# Patient Record
Sex: Male | Born: 1945 | Race: White | Hispanic: No | Marital: Married
Health system: Southern US, Community
[De-identification: ages and names within clinical notes are randomized; demographics above are authoritative.]

---

## 2008-05-26 ENCOUNTER — Emergency Department: Payer: Self-pay | Admitting: Emergency Medicine

## 2010-10-06 ENCOUNTER — Emergency Department: Payer: Self-pay | Admitting: Emergency Medicine

## 2011-07-21 ENCOUNTER — Inpatient Hospital Stay: Payer: Self-pay | Admitting: Internal Medicine

## 2011-07-21 LAB — CBC WITH DIFFERENTIAL/PLATELET
Basophil #: 0 10*3/uL (ref 0.0–0.1)
Basophil %: 0.1 %
Eosinophil #: 0.1 10*3/uL (ref 0.0–0.7)
Eosinophil %: 1.3 %
HGB: 16.5 g/dL (ref 13.0–18.0)
Lymphocyte #: 1.8 10*3/uL (ref 1.0–3.6)
Lymphocyte %: 20.7 %
MCH: 29.9 pg (ref 26.0–34.0)
MCV: 88 fL (ref 80–100)
Monocyte #: 0.3 10*3/uL (ref 0.0–0.7)
Neutrophil %: 74.8 %
Platelet: 324 10*3/uL (ref 150–440)
RBC: 5.52 10*6/uL (ref 4.40–5.90)
RDW: 13.6 % (ref 11.5–14.5)

## 2011-07-21 LAB — COMPREHENSIVE METABOLIC PANEL
Albumin: 3.7 g/dL (ref 3.4–5.0)
Anion Gap: 8 (ref 7–16)
BUN: 19 mg/dL — ABNORMAL HIGH (ref 7–18)
Calcium, Total: 9.8 mg/dL (ref 8.5–10.1)
Chloride: 103 mmol/L (ref 98–107)
Co2: 28 mmol/L (ref 21–32)
Creatinine: 1.21 mg/dL (ref 0.60–1.30)
EGFR (African American): >60
EGFR (Non-African Amer.): >60
Glucose: 101 mg/dL — ABNORMAL HIGH (ref 65–99)
Osmolality: 280 (ref 275–301)
Potassium: 4 mmol/L (ref 3.5–5.1)
SGOT(AST): 27 U/L (ref 15–37)
Sodium: 139 mmol/L (ref 136–145)
Total Protein: 8.4 g/dL — ABNORMAL HIGH (ref 6.4–8.2)

## 2011-07-21 LAB — PROTIME-INR
INR: 1.5
Prothrombin Time: 18.1 secs — ABNORMAL HIGH (ref 11.5–14.7)

## 2011-07-22 LAB — PROTIME-INR
INR: 1.6
Prothrombin Time: 19.6 secs — ABNORMAL HIGH (ref 11.5–14.7)

## 2011-07-23 LAB — PROTIME-INR
INR: 1.5
Prothrombin Time: 18 secs — ABNORMAL HIGH (ref 11.5–14.7)

## 2011-10-22 IMAGING — CT CT MAXILLOFACIAL WITHOUT CONTRAST
1 series · 16 of 30 positions shown, 20 images · non-contrast
Comparison: none

REASON FOR EXAM: swelling and ecchymosis R maxilla and orbit
COMMENTS:

[Series 4: coronal · coronal · 0.34mm/px · 16 of 47 slices shown, 20 images]
[im 4/47  brain]
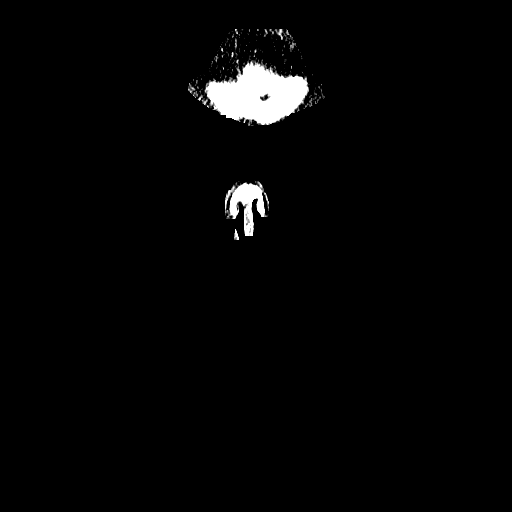
[im 4/47  bone]
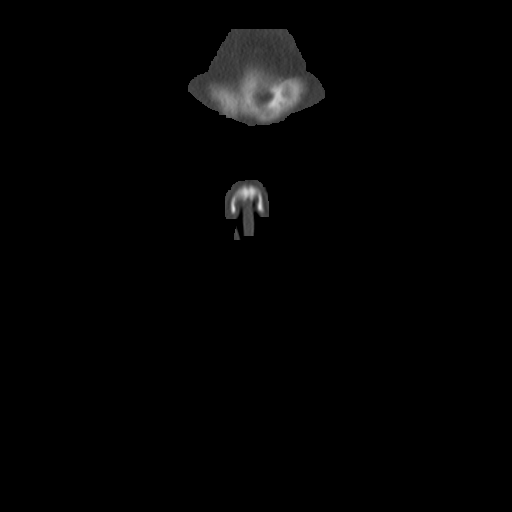
[im 7/47  bone]
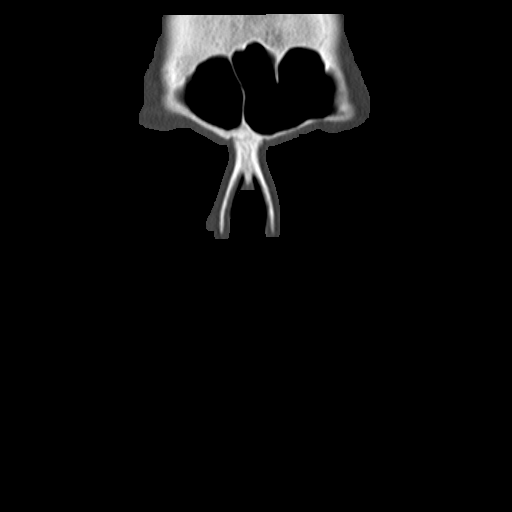
[im 10/47  bone]
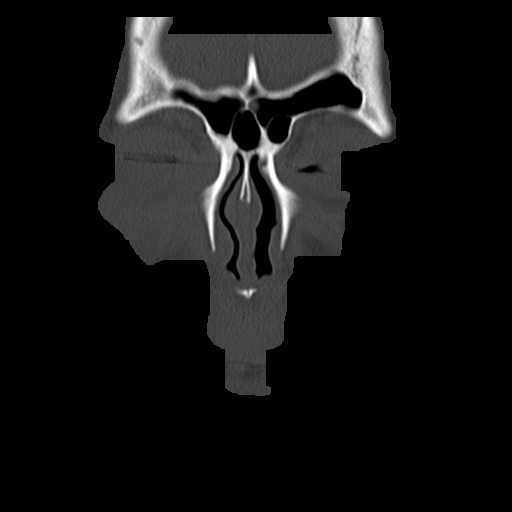
[im 12/47  bone]
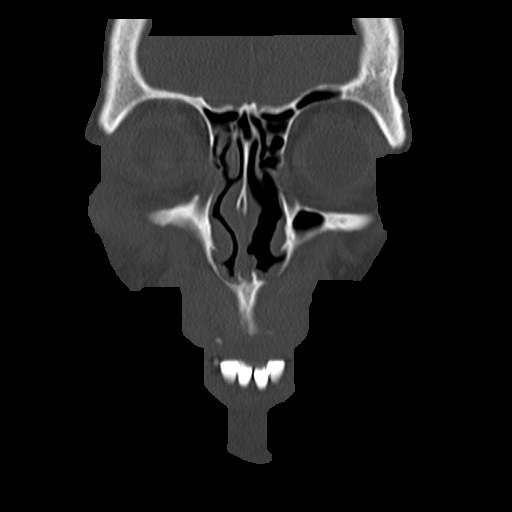
[im 15/47  brain]
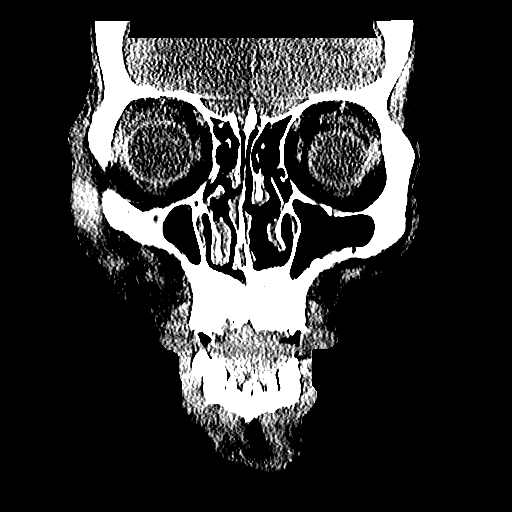
[im 15/47  bone]
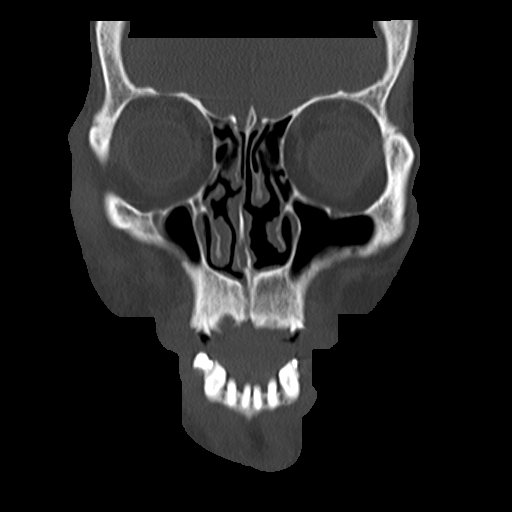
[im 18/47  bone]
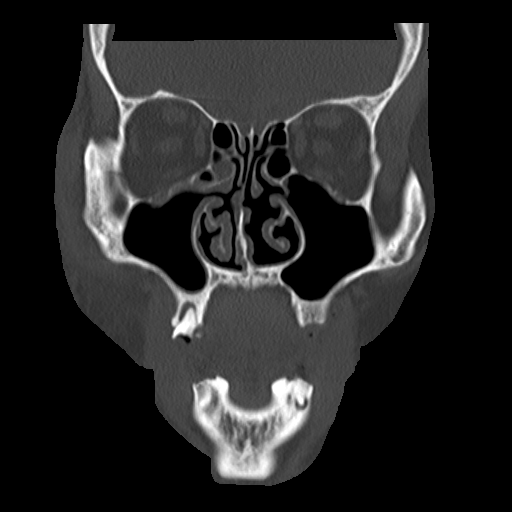
[im 20/47  bone]
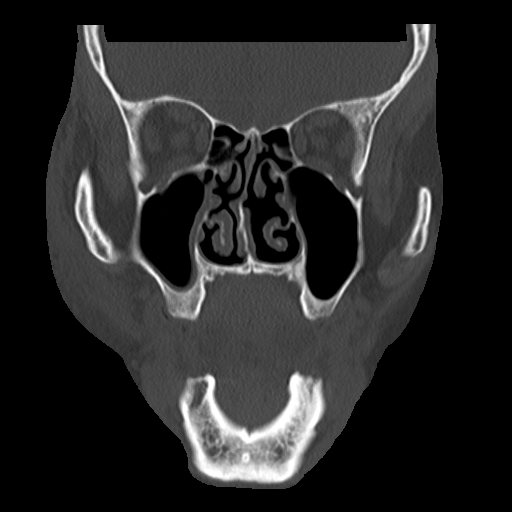
[im 23/47  bone]
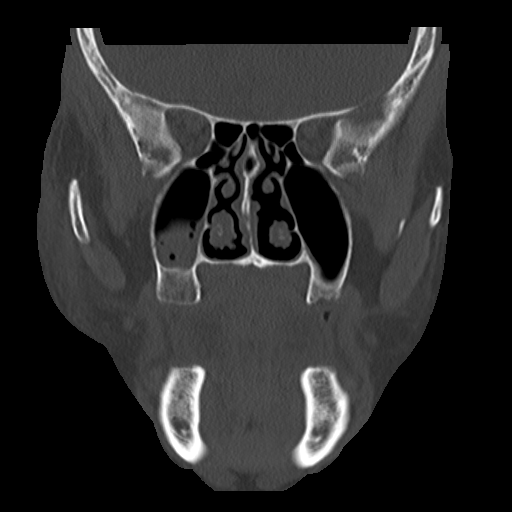
[im 26/47  brain]
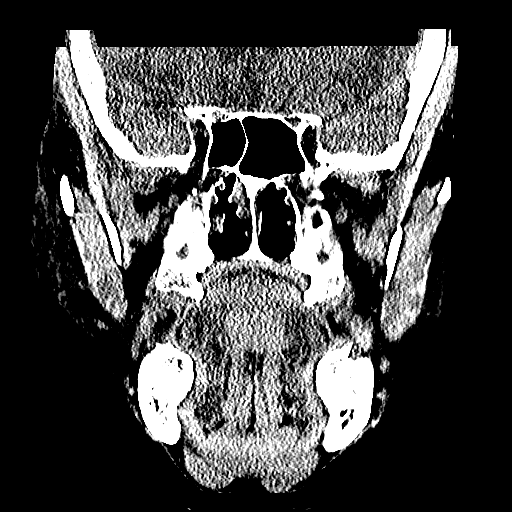
[im 26/47  bone]
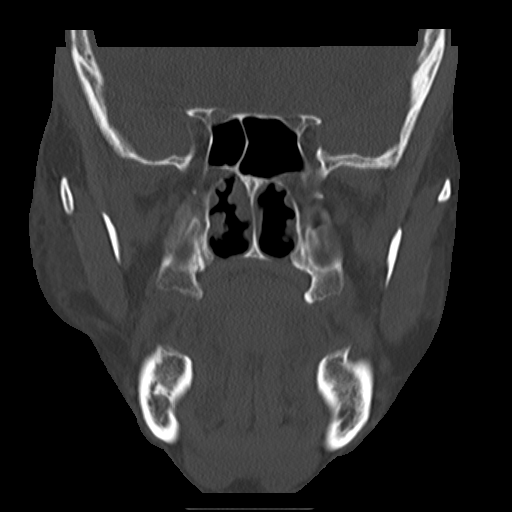
[im 29/47  bone]
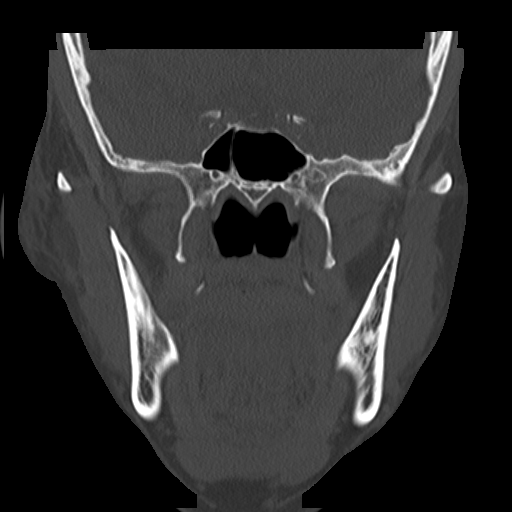
[im 31/47  bone]
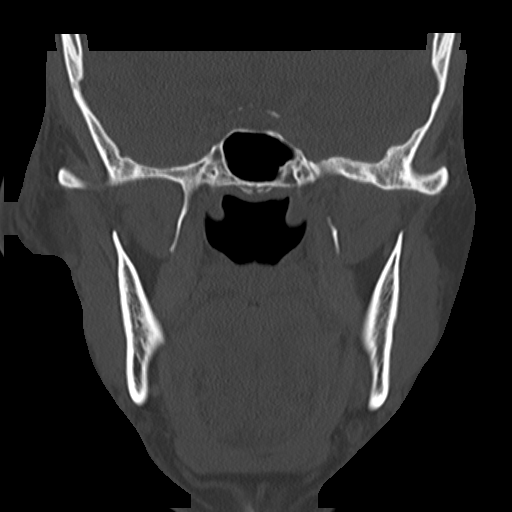
[im 34/47  bone]
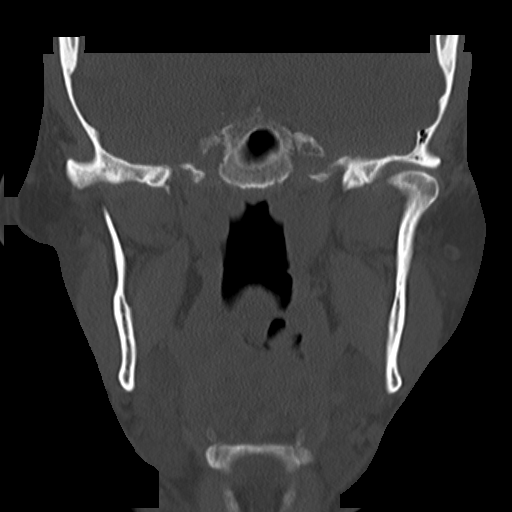
[im 37/47  brain]
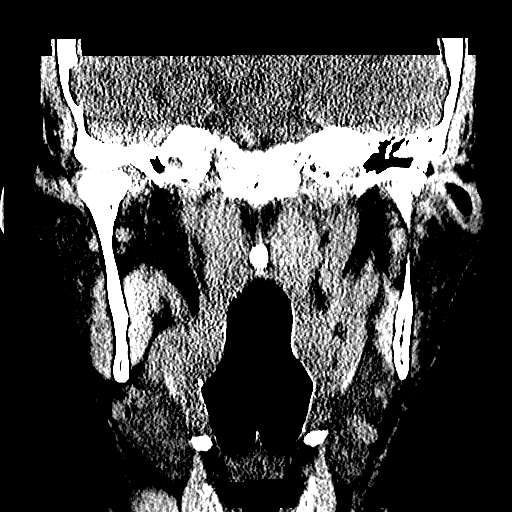
[im 37/47  bone]
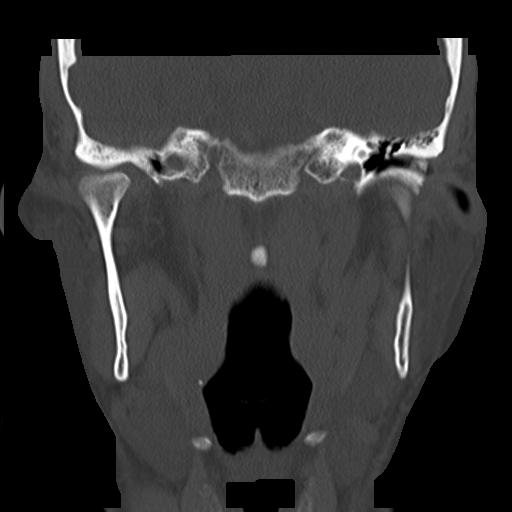
[im 39/47  bone]
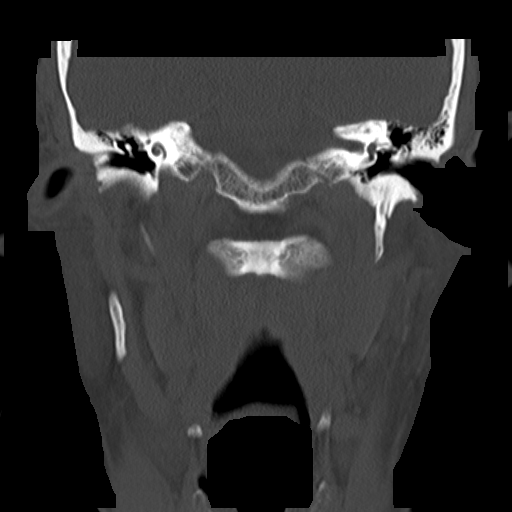
[im 42/47  bone]
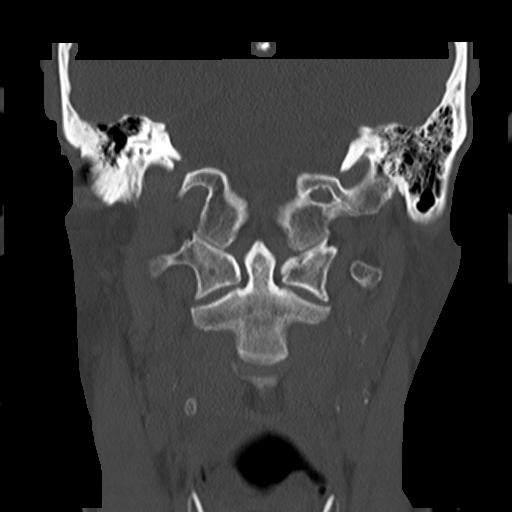
[im 45/47  bone]
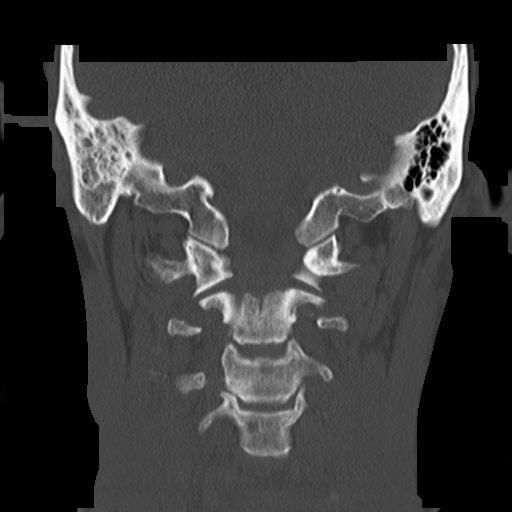

[16 of 30 positions shown; findings below may reference images not displayed]

PROCEDURE:     CT  - CT MAXILLOFACIAL AREA WO  - October 06, 2010  [DATE]

RESULT:     Emergent noncontrast CT of the maxillofacial structures
demonstrates air-fluid levels in the maxillary sinuses bilaterally. Is no
evidence of air within the orbits or pneumocephalus. The frontal, sphenoid
and ethmoid sinuses are predominantly normally aerated. The pterygoid plates
appear intact. Zygomatic arches appear intact. There is significant lucency
around the periodontal regions consistent with significant. On full disease.
The temporomandibular joints appear to be within normal limits. The nasal
septum demonstrates a slight curvature toward the right. The orbits appear
intact.
IMPRESSION: Air-fluid levels in the maxillary sinuses of uncertain
etiology. Occult fractures not completely excluded but no displaced fracture
is evident. Otherwise the appearance is unremarkable.

## 2012-08-05 IMAGING — CR DG ANKLE COMPLETE 3+V*L*
1 series · 5 of 5 positions shown · non-contrast
Comparison: none

REASON FOR EXAM: pain/edema 2nd to fall
COMMENTS:   May transport without cardiac monitor

PROCEDURE:     DXR - DXR ANKLE LEFT COMPLETE  - July 21, 2011 [DATE]
RESULT:     Comparison: None.

[Series 1: ap · 0.17mm/px · 5 of 5 slices shown]
[im 1/5]
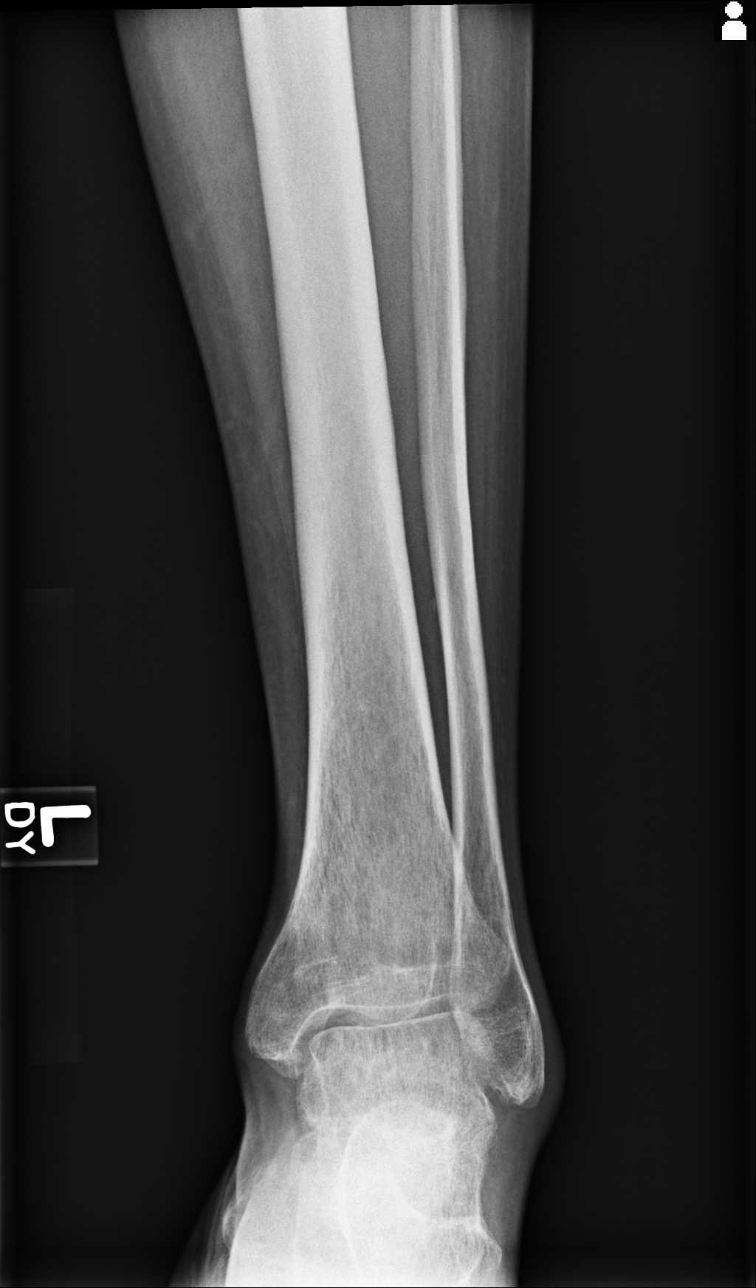
[im 2/5]
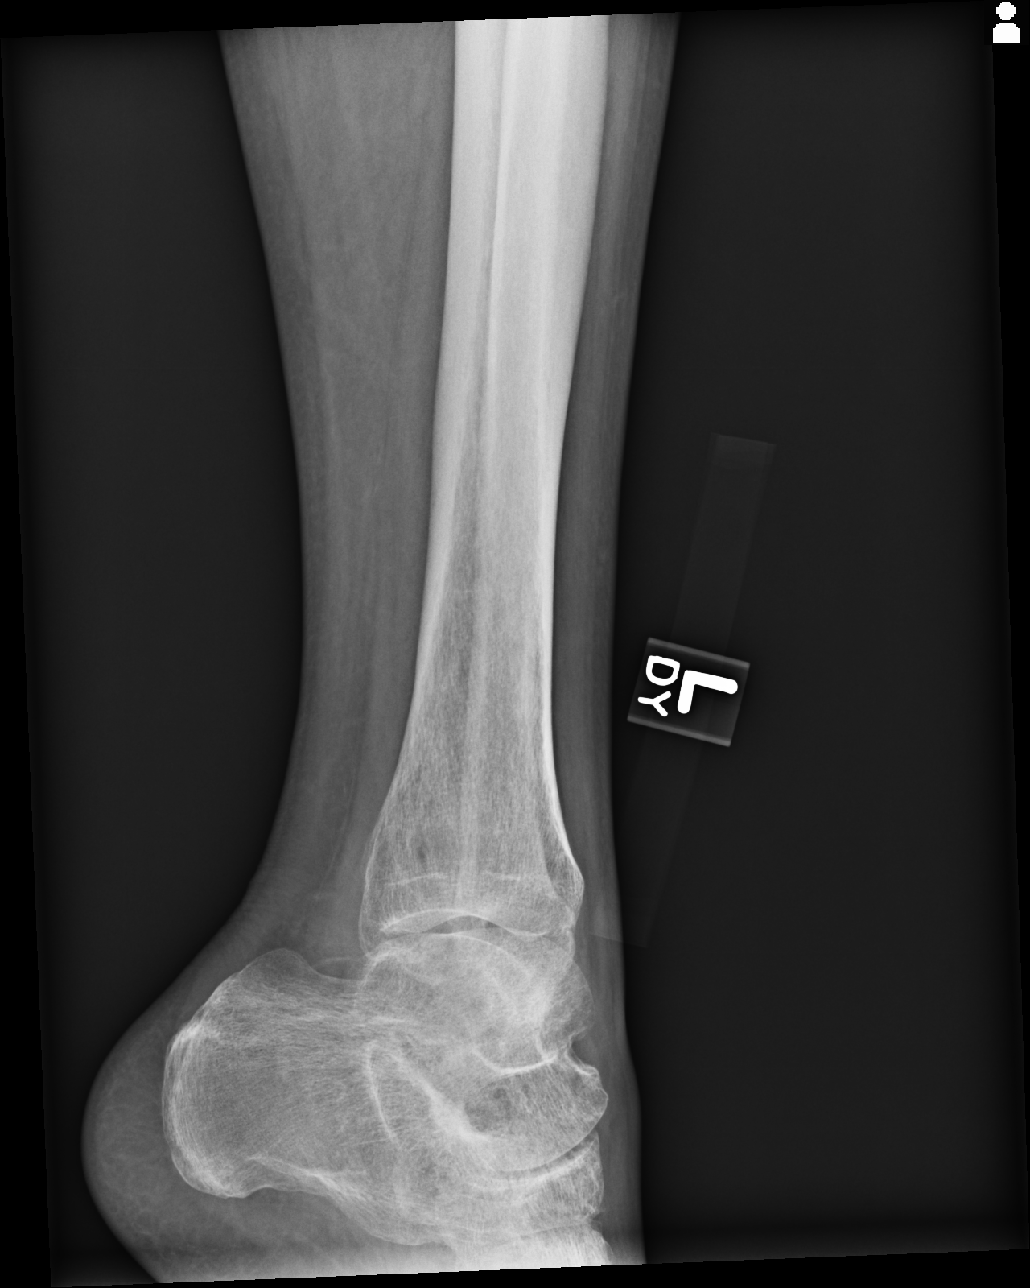
[im 3/5]
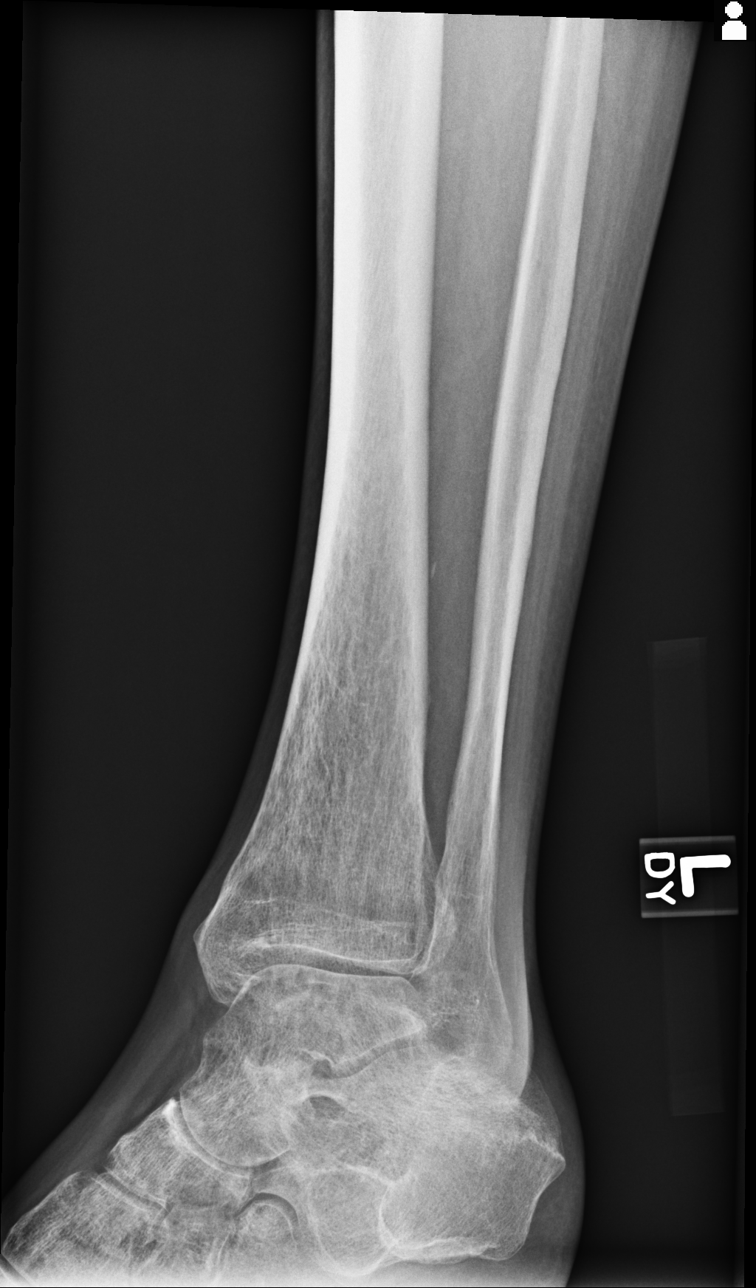
[im 4/5]
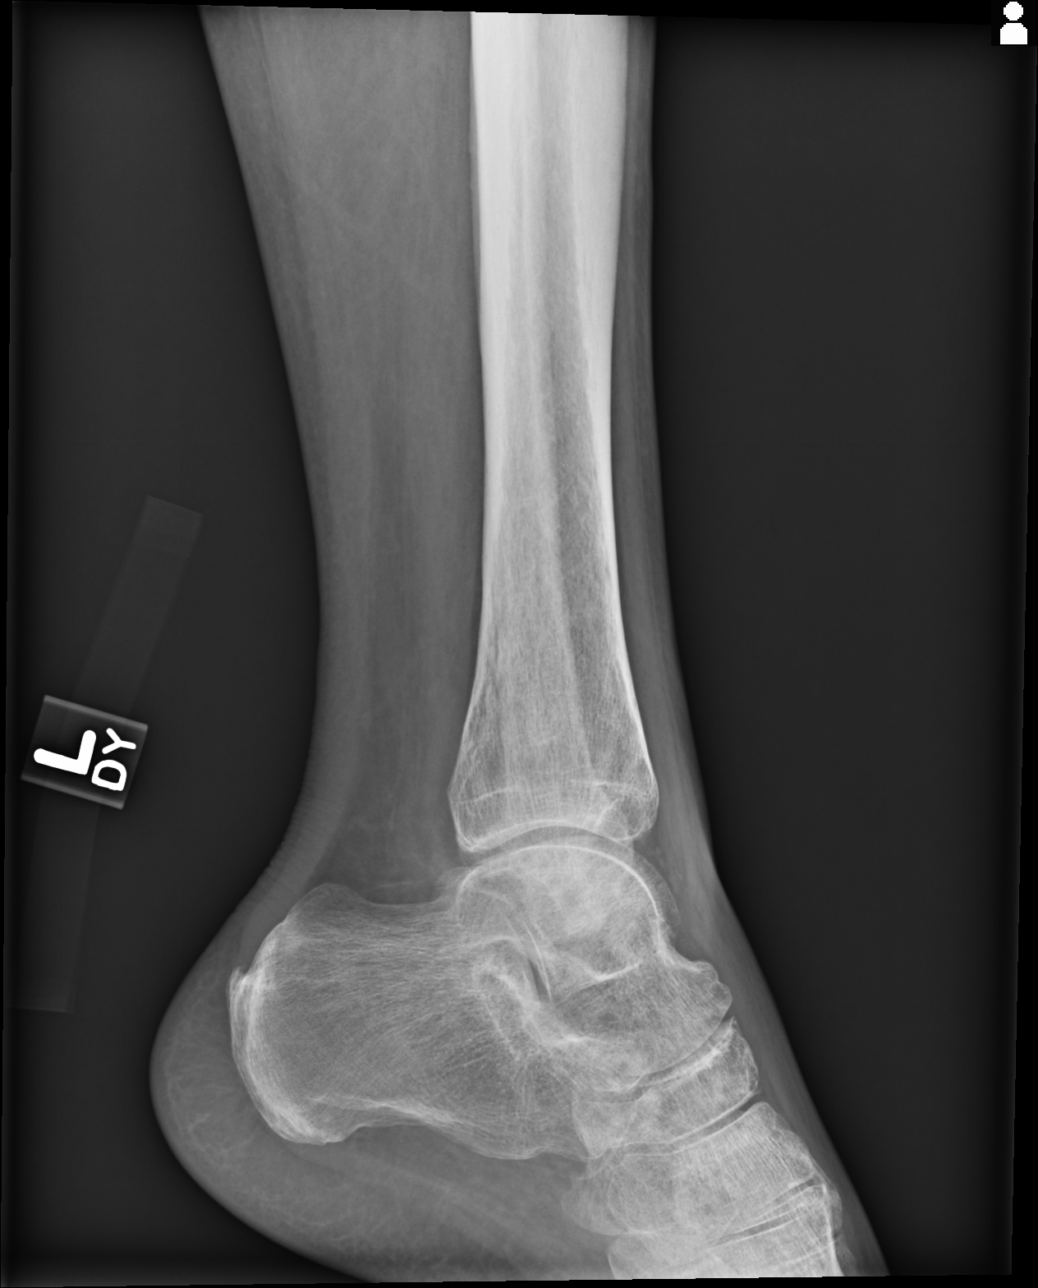
[im 5/5]
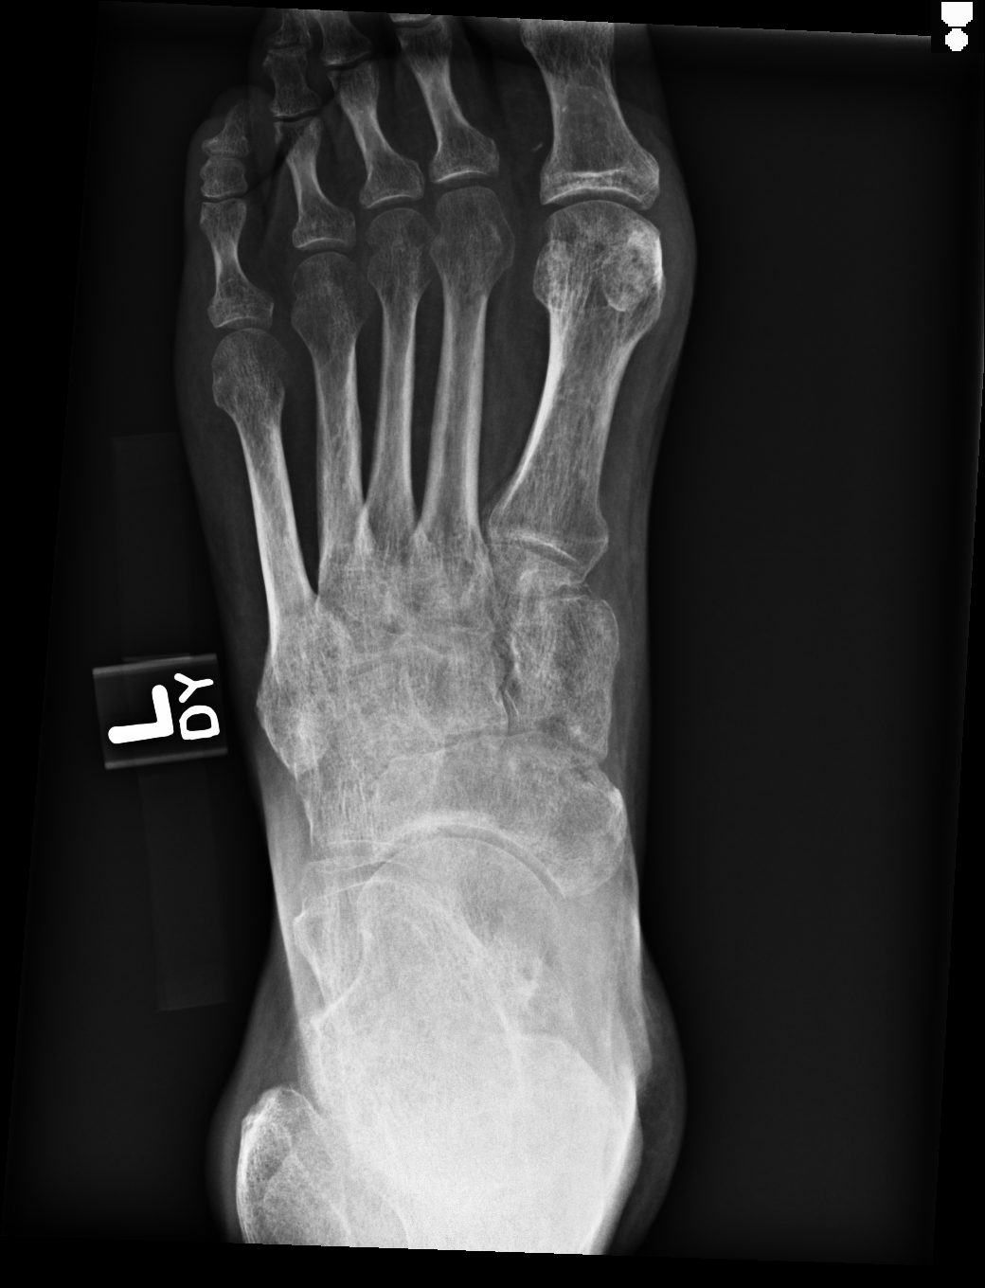

[5 of 5 positions shown; findings below may reference images not displayed]

FINDINGS: Relative osteopenia limits evaluation for fractures. No acute fracture seen.
Tibiotalar joint space is relatively preserved.
IMPRESSION: No acute fracture seen.

## 2012-08-05 IMAGING — US US EXTREM LOW VENOUS*L*
1 series · 17 of 24 positions shown · non-contrast
Comparison: none

REASON FOR EXAM: calf pain 2nd to fall.  Currently taking coumandin
COMMENTS:   LMP: (Male)

[Series 1: us extrem low venous*left* · 17 of 28 slices shown]
[im 1/28]
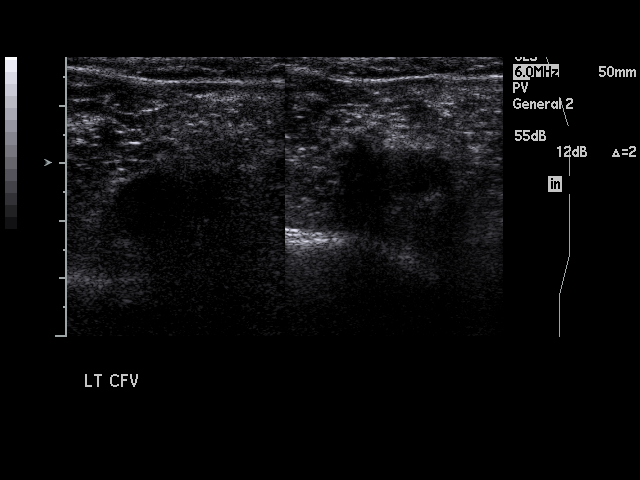
[im 3/28]
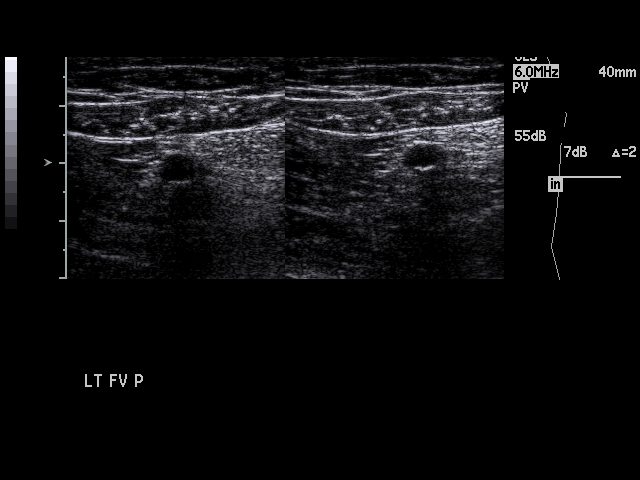
[im 4/28]
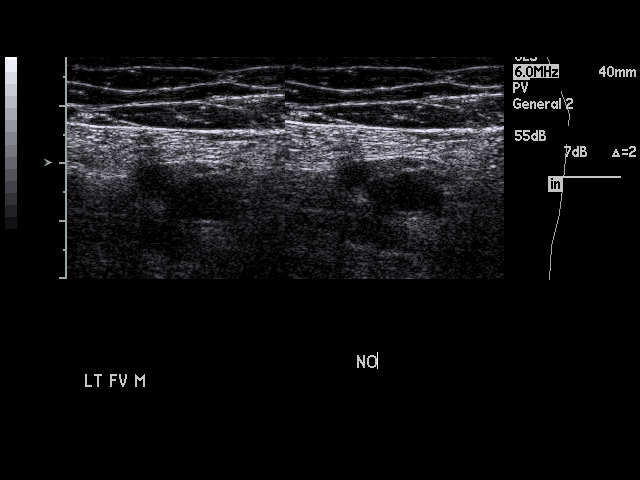
[im 5/28]
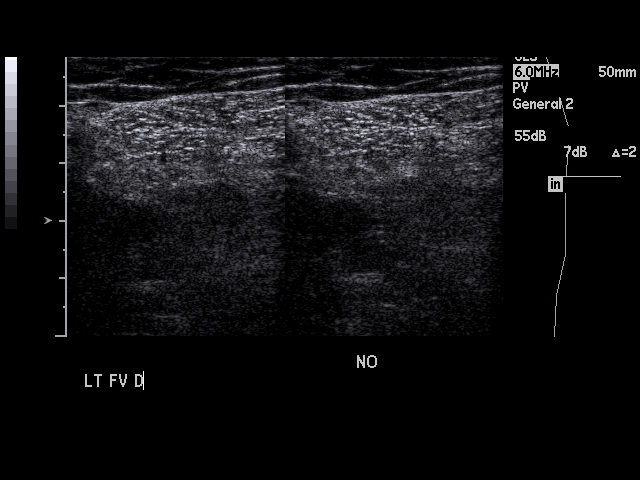
[im 8/28]
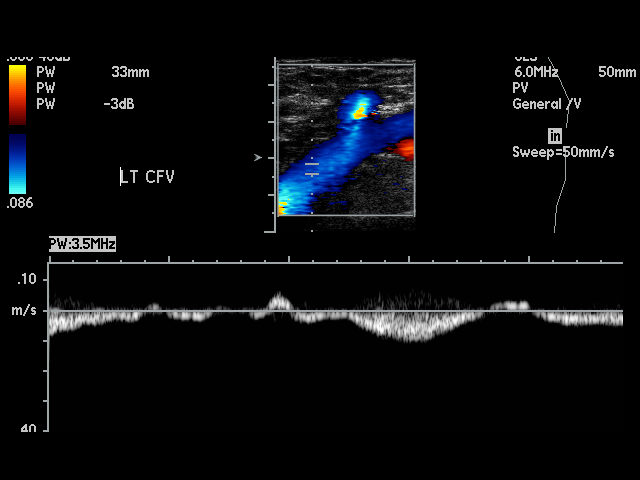
[im 9/28]
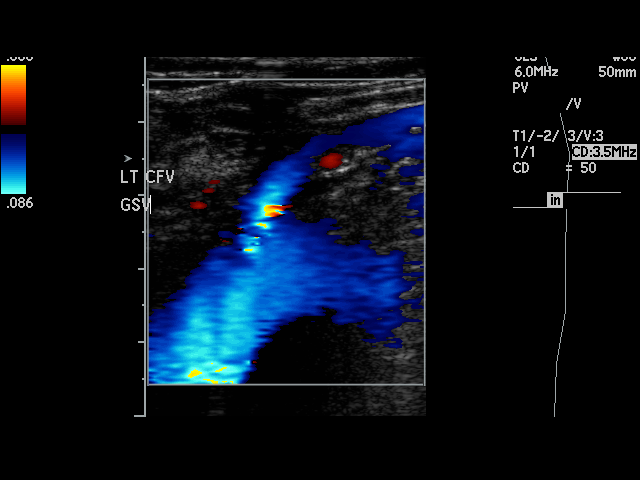
[im 11/28]
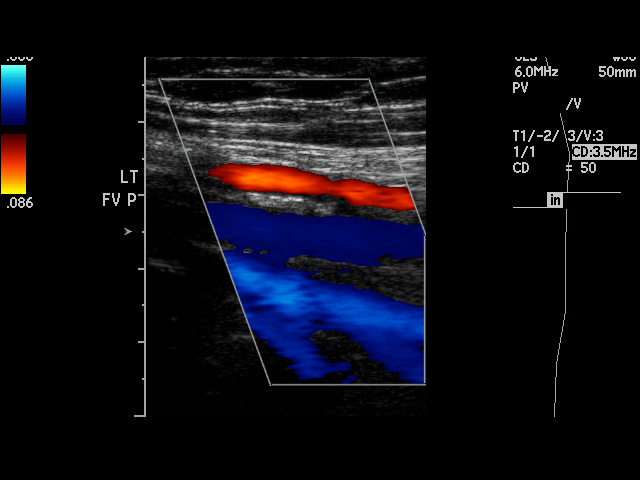
[im 12/28]
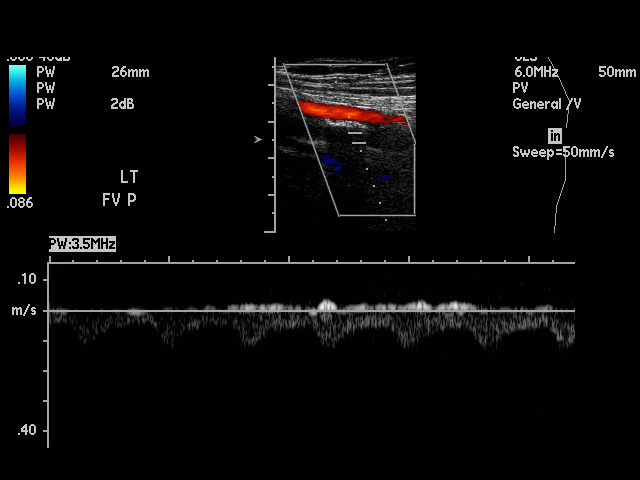
[im 15/28]
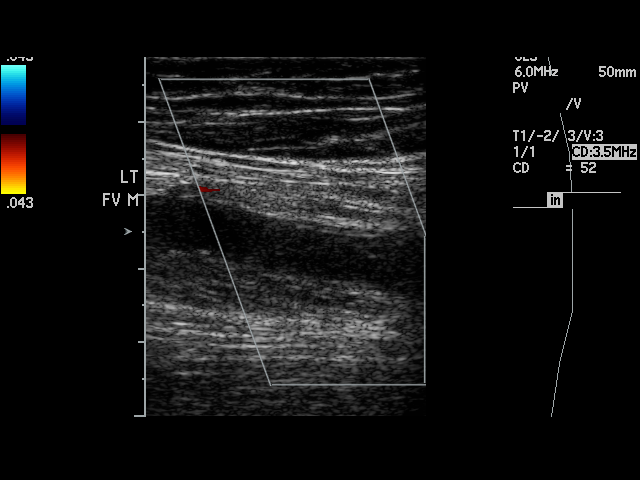
[im 16/28]
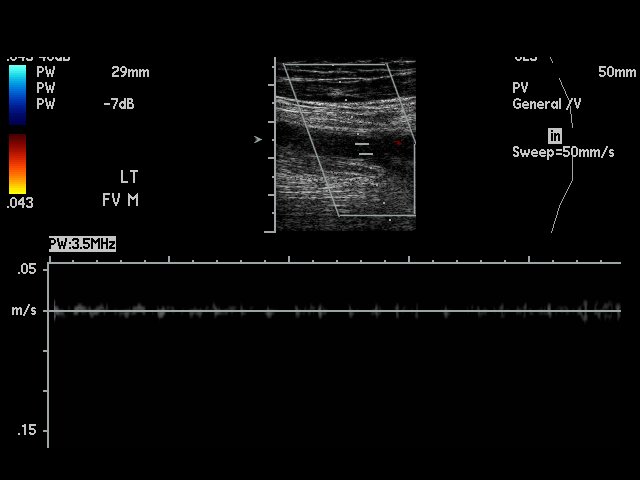
[im 17/28]
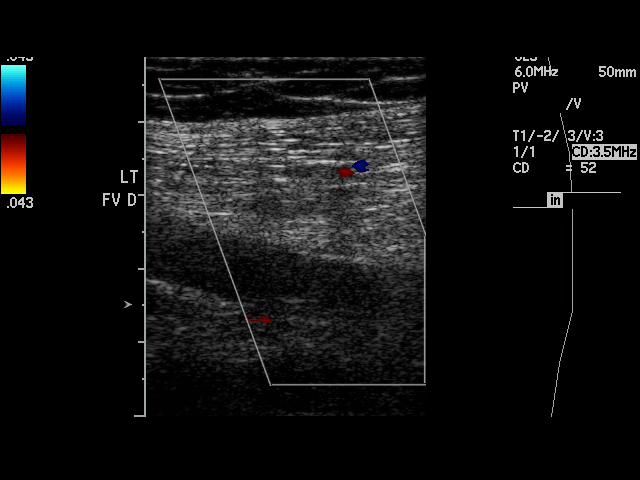
[im 19/28]
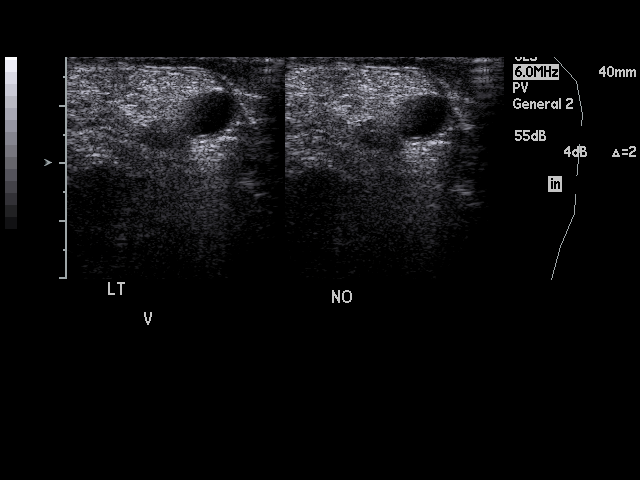
[im 20/28]
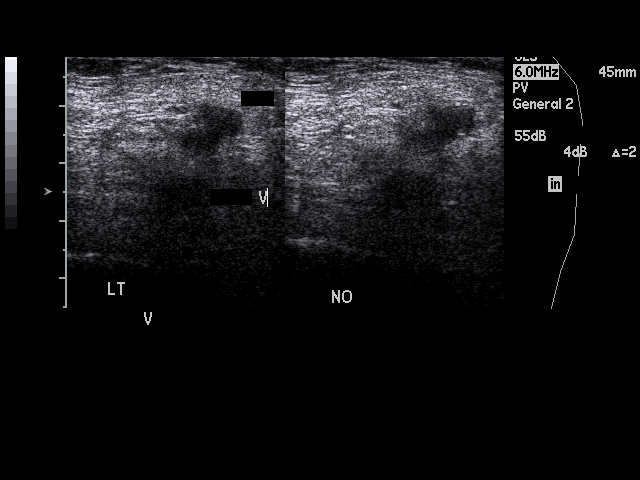
[im 23/28]
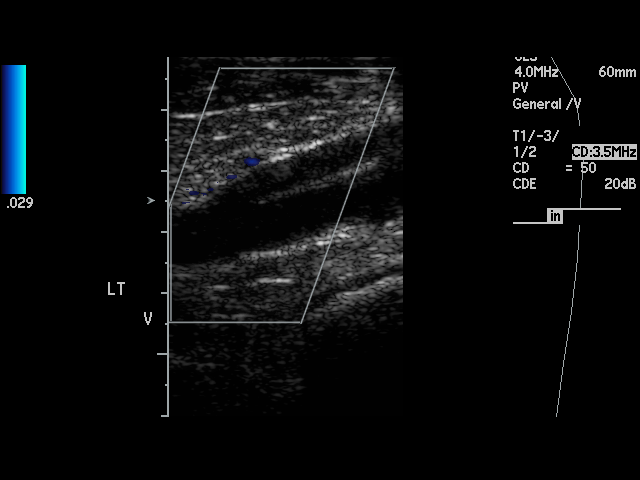
[im 24/28]
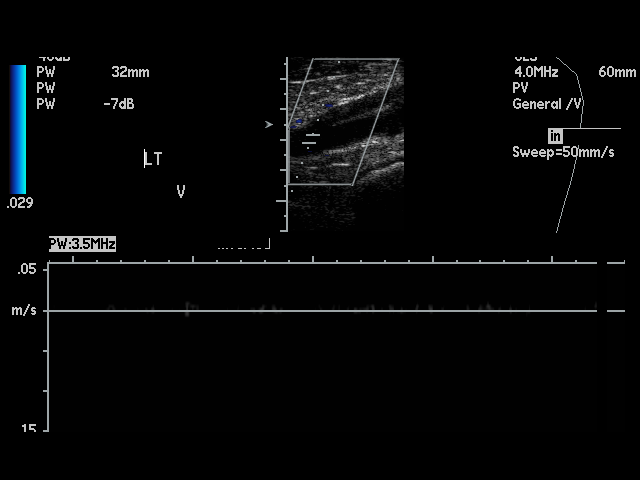
[im 25/28]
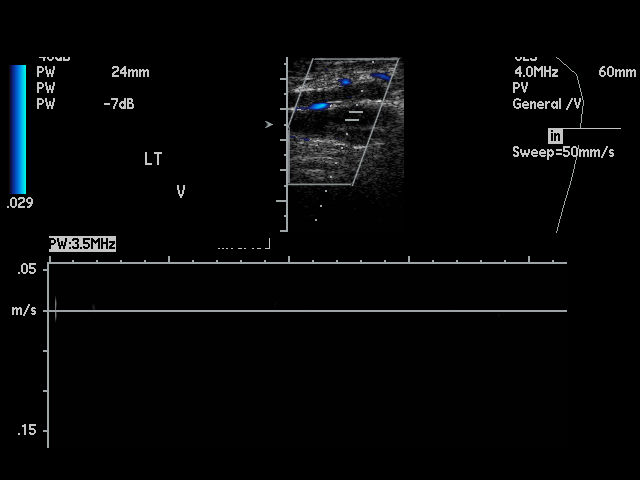
[im 28/28]
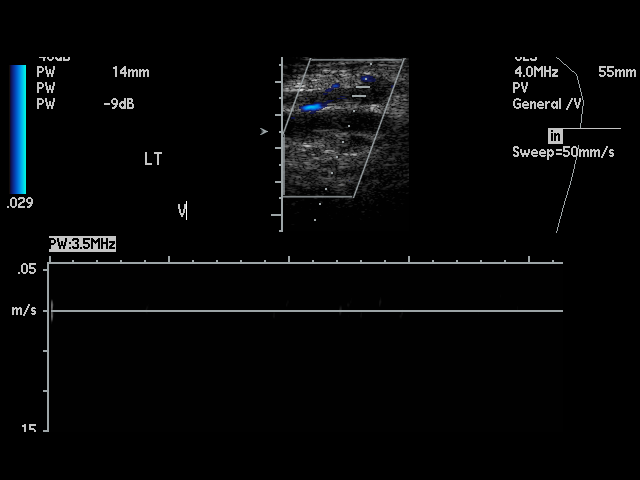

[17 of 24 positions shown; findings below may reference images not displayed]

PROCEDURE:     US  - US DOPPLER LOW EXTR LEFT  - July 21, 2011  [DATE]

RESULT:     Comparison: None

Technique and findings: Multiple longitudinal and transverse grayscale as
well as color and spectral Doppler images of the left lower extremity veins
were obtained from the common femoral veins through the popliteal veins.

The left common femoral vein is patent and compressible. The left femoral
and popliteal veins are not compressible. There is echogenic material within
these veins. No flow is identified within these veins with color Doppler or
spectral Doppler imaging. There appears to be occlusive thrombus in the
lesser saphenous vein as well.
IMPRESSION: Occlusive thrombus in the left femoral and left popliteal veins. There is
occlusive thrombus in the lesser saphenous vein.

The preliminary report was called to [facility] Kl at 1093 hours by the
ultrasound technologist.

## 2014-09-19 NOTE — Discharge Summary (Signed)
PATIENT NAME:  Steve Strickland, Fintan MR#:  [id number] DATE OF BIRTH:  August 13, 1945  DATE OF ADMISSION:  07/21/2011 DATE OF DISCHARGE:  07/23/2011  DISCHARGE DIAGNOSES:  1. Left lower extremity deep vein thrombosis.  2. Hypertension. 3. Cerebrovascular accident.  4. Depression. 5. Posttraumatic stress disorder.   DISPOSITION: The patient is being discharged home. As per [facility]   DIET: Low sodium.   ACTIVITY: As tolerated.   FOLLOW UP: The patient will require a PT-INR check-up in 2 to 3 days.   DISCHARGE MEDICATIONS:  1. Lovenox 80 mg subcutaneously b.i.d. for five days. 2. Coumadin 7.5 mg daily.  3. Trazodone 50 mg at bedtime. 4. Lisinopril 40 mg daily.  5. Multivitamin 1 tablet daily.  6. Atenolol 25 mg b.i.d.   LABORATORY, DIAGNOSTIC AND RADIOLOGICAL DATA:  Left lower extremity Doppler showed evidence off of occlusive deep vein thrombosis.  Left ankle x-ray showed no acute fracture.  CBC normal.  Complete metabolic panel essentially normal with glucose of 101, BUN 19.   HOSPITAL COURSE: The patient is a 69 year old male with past medical history of cerebrovascular accident-apparently for that he was on Coumadin, hypertension, depression and PTSD, who presented with left lower extremity swelling and pain. A lower extremity Doppler revealed the presence of deep venous thrombosis. The patient was on Coumadin for 1-1/2 years; however, his INR was therapeutic. He usually went to the [facility] and had his PT-INR checked once every month. He denied any prior history of deep vein thrombosis. He was started on Lovenox therapeutic dose, and the dose of his oral Coumadin was increased. The patient was offered the opportunity to go home with Lovenox, bridging with oral Coumadin, versus staying in the hospital until his INR was therapeutic. The patient preferred to learn to self-inject Lovenox and go home. Case Management was consulted. Case Management was able  to arrange overlap therapy with therapeutic Lovenox. The patient received teaching of self-injection of Lovenox by the RN. He will continue taking Coumadin. He has been advised to have a PT-INR check-up in 2 to 3 days of discharge. Case Management tried to arrange Home Health for the patient through the [facility]. As per the [facility], they will arrange Home Health for the patient within 1 to 2 days.   The rest of the patient's medical problems remained stable, and he is being discharged home in stable condition.   TIME SPENT:   45 minutes.   ____________________________ Darrick MeigsSangeeta Kashtyn Jankowski, MD sp:cbb D: 07/23/2011 19:40:05 ET T: 07/24/2011 13:22:04 ET JOB#: [id number]  cc: Darrick MeigsSangeeta Rolen Conger, MD, <Dictator> [facility] Darrick MeigsSANGEETA Sunny Gains MD ELECTRONICALLY SIGNED 07/25/2011 14:11

## 2014-09-19 NOTE — H&P (Signed)
PATIENT NAME:  Steve Strickland, Steve MR#:  [id number] DATE OF BIRTH:  07-23-1945  DATE OF ADMISSION:  07/21/2011  PRIMARY CARE PHYSICIAN: None local.   REFERRING PHYSICIAN: Daryel NovemberJonathan Williams, MD  CHIEF COMPLAINT: Left leg pain 7 days after fall.   HISTORY OF PRESENT ILLNESS: This is a 69 year old male with a history of hypertension, CVA, MI, depression, and hepatitis C who presented to the ED with the above chief complaint. The patient is alert, awake, and oriented, in no acute distress. The patient stated that he fell by accident seven days ago and developed sudden left leg pain since then and could not walk as before. The leg is constant and radiating from the knee to the foot, but without swelling or erythema. The patient had a CVA and heart attack in 2010. He cannot move his leg and ankle due to CVA, but he uses a walker to walk. The patient has been taking Coumadin since that time. He denies any chest pain, palpitations, shortness of breath, cough, or hematemesis. No orthopnea. No nocturnal dyspnea. Denies any recent travel history. Duplex shows left leg deep venous thrombosis. The patient's INR today is 1.5 and he was treated with Lovenox and Coumadin in the ED.   PAST MEDICAL HISTORY:  1. Hypertension. 2. Cerebrovascular accident. 3. Myocardial infarction. 4. Depression and anxiety. 5. Hepatitis C.   SOCIAL HISTORY: The patient has smoked about four cigarettes a day for more than 40 years. He denies any alcohol drinking or illicit drugs.   PAST SURGICAL HISTORY: None.  FAMILY HISTORY: The patient was adopted. Only his brother has cerebrovascular accident and heart attack.   DRUG ALLERGIES: No known drug allergies.  MEDICATIONS:  1. Atenolol 50 mg p.o. 1/2 tablet twice a day. 2. Lisinopril 40 mg p.o. daily. 3. Multivitamin one tablet p.o. daily. 4. Warfarin 5 mg p.o. daily.  5. Aspirin 325 mg p.o. daily.   REVIEW OF SYSTEMS: CONSTITUTIONAL: The patient denies any fever or chills. Has  mild headache but no dizziness. No weight loss. Has fatigue. EYES: No double vision, double vision, glaucoma or cataract. ENT: No hearing loss, epistaxis, or bleeding. Has had postnasal drip. RESPIRATORY: No cough, sputum, shortness of breath, or hemoptysis. CARDIOVASCULAR: No chest pain, palpitations, orthopnea, or nocturnal dyspnea. GI: No nausea or vomiting. No abdominal pain or diarrhea. No melena or bloody stool. GU: No dysuria, hematuria, or incontinence. SKIN: No rash or jaundice. MUSCULOSKELETAL: Left leg pain. NEUROLOGY: No syncope, loss of consciousness, or seizure. PSYCH: No depression or anxiety.   PHYSICAL EXAMINATION:   VITALS: Temperature 97.7, blood pressure 135/79, pulse 102, and oxygen saturation 97% on room air.   GENERAL: The patient is alert, awake, and oriented, in no acute distress.   HEENT: Pupils are round, equal, and reactive to light and accommodation. Moist oral mucosa. Clear oropharynx.   NECK: Supple. No JVD or carotid bruit. No lymphadenopathy. No thyromegaly.   CARDIOVASCULAR: S1, S2 regular rate and rhythm. No murmurs or gallops.   PULMONARY: Bilateral air entry. No wheezing or rales.   NECK: Supple.   ABDOMEN: Soft. No distention or tenderness. No organomegaly. Bowel sounds present.   EXTREMITIES: No edema, clubbing, or cyanosis. Left calf tenderness. In addition, the patient has tenderness on the inner side of thigh. The patient is unable to move his left ankle. Otherwise power is 4/5 on the left lower extremity, 5/5 in other extremities. Sensation is intact. Deep tendon reflexes are 2+.   SKIN: No rash or jaundice.   NEUROLOGIC: Alert  and oriented x3. No focal deficits.   LABS/STUDIES: WBC 8.5, hemoglobin 16.5, platelets 324. PT 18.1. INR 1.5.  Venous duplex: Occlusive thrombus in the left femoral and left popliteal veins. There is occlusive thrombus in the lesser saphenous vein.   Glucose 101, BUN 19, creatinine 1.21, sodium 139, potassium 4.0,  chloride 103, bicarbonate 28.  Left ankle x-ray: No acute fracture.   IMPRESSION:  1. Left leg deep venous thrombosis.  2. Subtherapeutic INR.  3. History of cerebrovascular accident, hypertension, and coronary artery disease.   PLAN OF TREATMENT: The patient will be admitted to the telemetry floor. We will continue Lovenox and Coumadin, dose per pharmacy, and follow-up INR. In addition, we will continue aspirin, continue atenolol and lisinopril. GI prophylaxis.   TIME SPENT: Approximately 60 minutes.  ____________________________ Shaune Pollack, MD qc:slb D: 07/21/2011 16:11:39 ET     T: 07/22/2011 08:03:59 ET       JOB#: [id number] cc: Shaune Pollack, MD, <Dictator> Shaune Pollack MD ELECTRONICALLY SIGNED 07/22/2011 13:36

## 2018-06-28 DEATH — deceased
